# Patient Record
Sex: Female | Born: 1935
Health system: Midwestern US, Community
[De-identification: ages and names within clinical notes are randomized; demographics above are authoritative.]

---

## 2020-02-10 ENCOUNTER — Ambulatory Visit: Attending: Cardiovascular Disease

## 2020-02-10 ENCOUNTER — Ambulatory Visit: Admit: 2020-02-10 | Discharge: 2020-02-10 | Payer: MEDICARE | Attending: Cardiovascular Disease

## 2020-02-10 DIAGNOSIS — R42 Dizziness and giddiness: Secondary | ICD-10-CM

## 2020-02-10 NOTE — Progress Notes (Signed)
02/10/2020    Geraldo Pitter, NP    RE: Sanda Dejoy           11-08-1927    Dear Geraldo Pitter, NP    I had the pleasure of seeing your patient Heather Daniels in the office today. Although you are familiar with her past medical history, I shall review it here for my records.    HISTORY OF PRESENT ILLNESS: Heather Daniels is a 86 y.o. female who comes to the office today for had concerns including Palpitations and Dizziness.  Yesterday morning she noted some lightheadedness especially when she tried to do some activity around the house she also noted some racing of her heart this progressed over an hour and she went to [facility] emergency room there a baseline EKG revealed sinus rhythm with a prolonged first-degree AV block.  At other times she was tachycardic at a rate of 120 and there was a suggestion that the P wave was buried in the TUR this is not certain she initially presented with some hypotension which responded to fluids.  After several hours she felt better and she was discharged today she feels fine without any lightheadedness or racing.  She denies any chest discomfort dyspnea on exertion PND or orthopnea prior to this episode yesterday she occasionally had some racing that would last for seconds risk factors for CAD includes hyperlipidemia and hypertension    CURRENT MEDICATIONS:  Patient has a current medication list which includes the following prescription(s): pravastatin, escitalopram oxalate, oxybutynin chloride, alprazolam, olmesartan, multivitamin, and biotin.    ALLERGIES: Patient has No Known Allergies.    ROS: Review of Systems   Constitutional: Negative for chills and fever.   Eyes: Negative for double vision.   Respiratory: Negative for cough, sputum production and shortness of breath.    Cardiovascular: Positive for palpitations. Negative for chest pain, orthopnea, leg swelling and PND.   Gastrointestinal: Negative for abdominal pain, nausea  and vomiting.   Genitourinary: Negative for dysuria and hematuria.   Musculoskeletal: Negative for falls and myalgias.   Skin: Negative for rash.   Neurological: Positive for dizziness. Negative for sensory change, speech change, focal weakness, loss of consciousness and headaches.   Psychiatric/Behavioral: The patient is nervous/anxious.          FAMILY HISTORY: Patient History reviewed. No pertinent family history.    SOCIAL HISTORY: Patient   Social History     Socioeconomic History   ??? Marital status: UNKNOWN     Spouse name: Not on file   ??? Number of children: Not on file   ??? Years of education: Not on file   ??? Highest education level: Not on file   Tobacco Use   ??? Smoking status: Never Smoker   ??? Smokeless tobacco: Never Used     Social Determinants of Psychologist, prison and probation services Strain:    ??? Difficulty of Paying Living Expenses:    Food Insecurity:    ??? Worried About Programme researcher, broadcasting/film/video in the Last Year:    ??? Barista in the Last Year:    Transportation Needs:    ??? Freight forwarder (Medical):    ??? Lack of Transportation (Non-Medical):    Physical Activity:    ??? Days of Exercise per Week:    ??? Minutes of Exercise per Session:    Stress:    ??? Feeling of Stress :    Social Connections:    ??? Frequency  of Communication with Friends and Family:    ??? Frequency of Social Gatherings with Friends and Family:    ??? Attends Religious Services:    ??? Database administrator or Organizations:    ??? Attends Engineer, structural:    ??? Marital Status:        PAST MEDICAL HISTORY: Patient   Past Medical History:   Diagnosis Date   ??? Cardiac arrhythmia    ??? Hyperlipidemia    ??? Hypertension        PAST SURGICAL HISTORY: Patient    Past Surgical History:   Procedure Laterality Date   ??? HX TONSILLECTOMY           PE: Patient  height is 5\' 1"  (1.549 m) and weight is 120 lb (54.4 kg). Her blood pressure is 140/88 (abnormal) and her pulse is 74. Her respiration is 12.  She had no neck vein distention and her  carotids were 2+ her thyroid was not palpable her lungs were clear and she had a normal S1 normal S2 without a murmur or rub her abdomen was soft and she had no clubbing cyanosis or edema                      LABS & TESTING: Baseline EKG at [facility] revealed sinus rhythm with a prolonged first-degree left axis deviation poor R wave progression    ASSESSMENT AND PLAN: Patient with lightheadedness and abnormal EKG with marked arrhythmia?  Sinus arrhythmia.  She will go for thyroid function test she will return for an echocardiogram and stress test she may need an event monitor    I discussed the patient's BMI with her.  I have recommended the following interventions: dietary management education, guidance, and counseling.      I attest that current meds have been reviewed and are accurate to the best of my knowledge.    Thank you for the courtesy of this consultation.    Sincerely yours,    [facility] , MD

## 2020-02-10 NOTE — Telephone Encounter (Signed)
error 

## 2020-02-12 LAB — TSH 3RD GENERATION
TSH: 1.25 mIU/L (ref 0.40–4.50)
TSH: 1.25 mIU/L (ref 0.40–4.50)

## 2020-02-12 LAB — T4 (THYROXINE): T4, Total: 5.6 ug/dL (ref 5.1–11.9)

## 2020-02-12 LAB — T3 TOTAL: T3, total: 88 ng/dL (ref 76–181)

## 2020-02-12 LAB — T3: T3, Total: 88 ng/dL (ref 76–181)

## 2020-02-12 LAB — T4: T4, Total: 5.6 ug/dL (ref 5.1–11.9)

## 2020-03-01 ENCOUNTER — Encounter

## 2020-03-01 ENCOUNTER — Encounter: Attending: Cardiovascular Disease

## 2022-09-29 IMAGING — MR MRI BRAIN WITHOUT CONTRAST
7 of 11 series · 23 of 48 positions shown · IV contrast (gadolinium)
Comparison: None

________________________________________________________________________________________________ 
MRI BRAIN WITHOUT CONTRAST, 09/29/2022 [DATE]: 
CLINICAL INDICATION: Amnesia. Cognitive decline for 3 years. Slurred speech. 
No trauma.
TECHNIQUE: Multiplanar, multiecho position MR images of the brain were performed 
without intravenous gadolinium enhancement. Patient was scanned on a
magnet.

[Series 102: mpr - smartbrain · axial · 1.1mm · 1.09mm/px · z∈[+0,+174]mm · 2 of 2 slices shown]
[im 1/2]
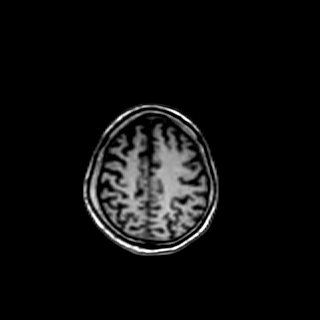
[im 2/2]
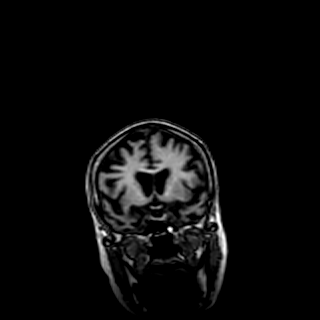

[Series 203: dadc map · axial · 5.0mm · 1.03mm/px · z∈[-146,+6]mm · 2 of 27 slices shown (1 of 2)]
[im 1/27]
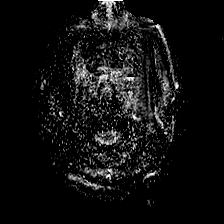
[im 27/27]
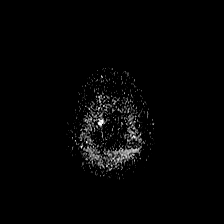

[Series 204: (id) · axial · 5.0mm · 1.03mm/px · z∈[-146,+6]mm · 2 of 27 slices shown (1 of 2)]
[im 1/27]
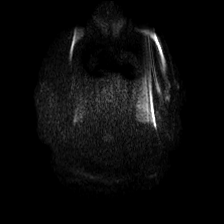
[im 27/27]
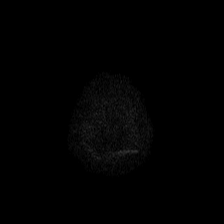

[Series 303: dadc map · coronal · 5.0mm · 0.90mm/px · 2 of 26 slices shown (2 of 2)]
[im 1/26]
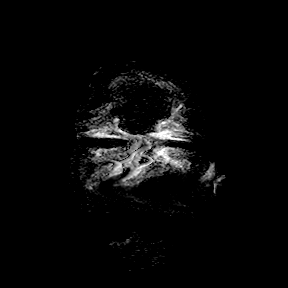
[im 26/26]
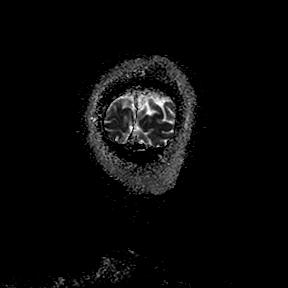

[Series 304: (id) · coronal · 5.0mm · 0.90mm/px · 2 of 27 slices shown (2 of 2)]
[im 1/27]
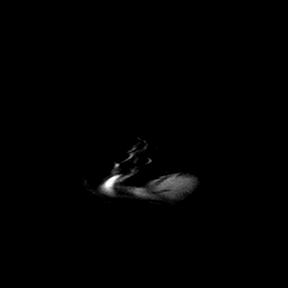
[im 27/27]
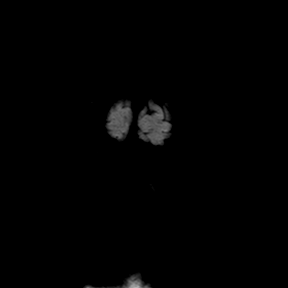

[Series 401: t1_se_sag · sagittal · 4.0mm · 0.43mm/px · 2 of 28 slices shown]
[im 1/28]
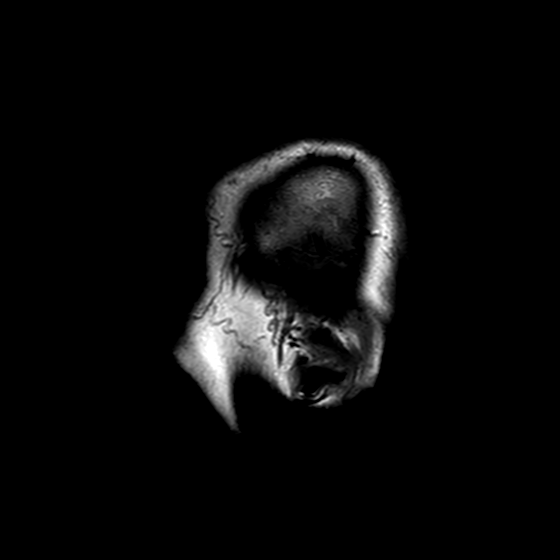
[im 28/28]
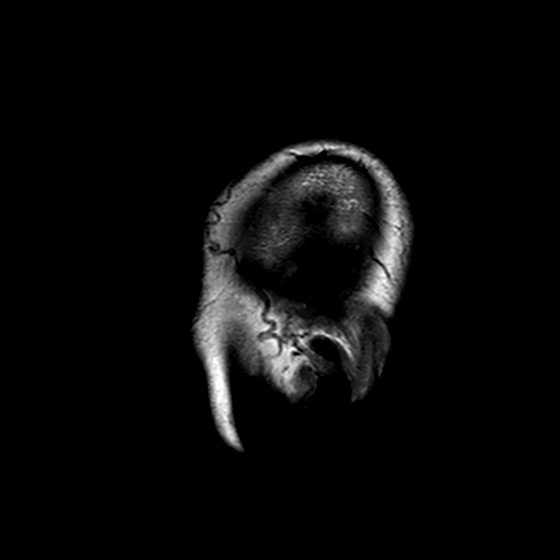

[Series 601: SWI · axial · 3.0mm · 0.40mm/px · z∈[-132,-5]mm · 11 of 200 slices shown]
[im 13/200]
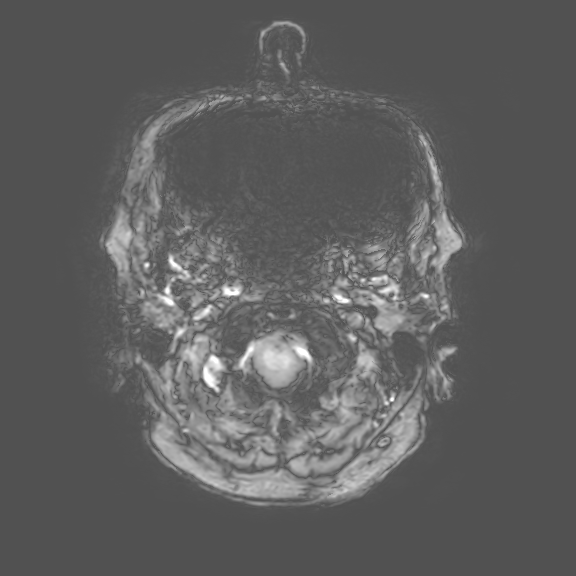
[im 25/200]
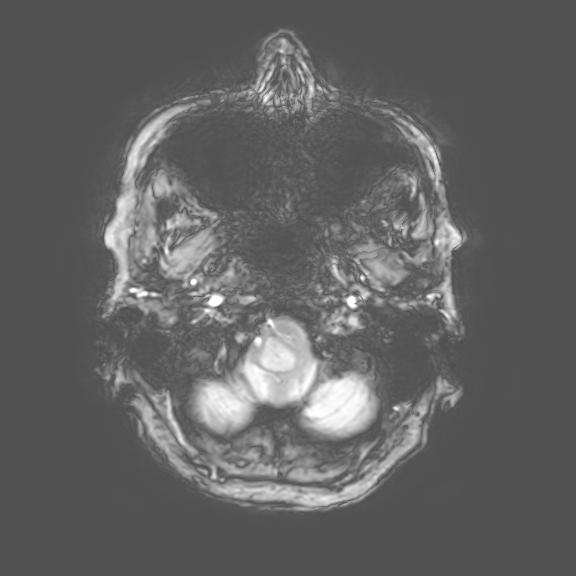
[im 38/200]
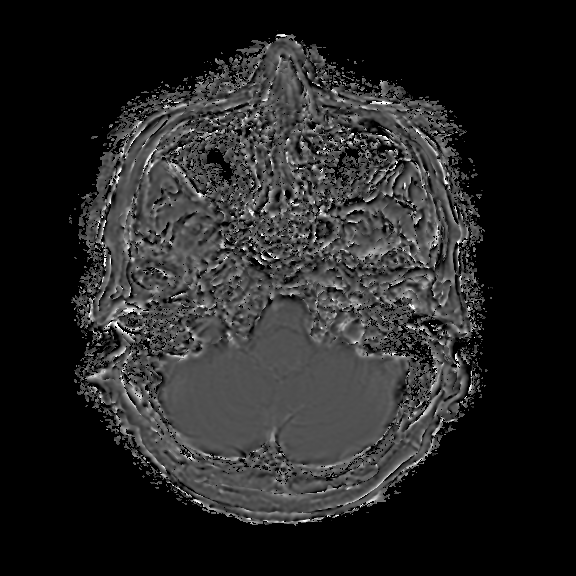
[im 63/200]
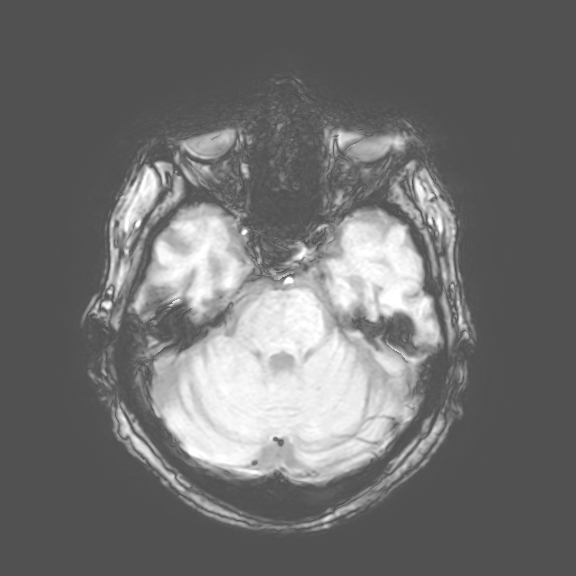
[im 88/200]
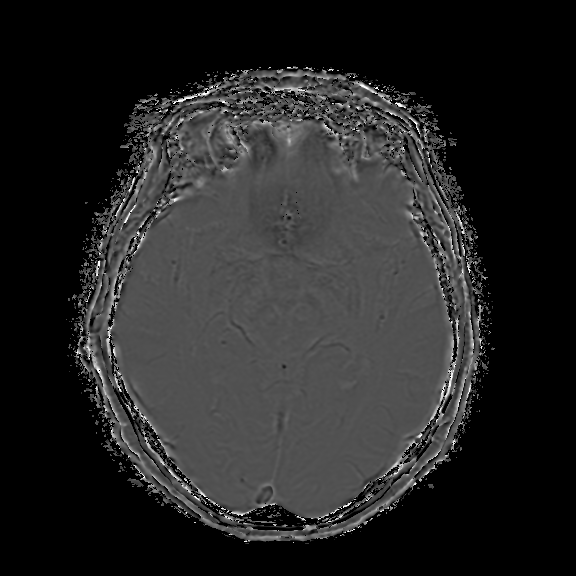
[im 100/200]
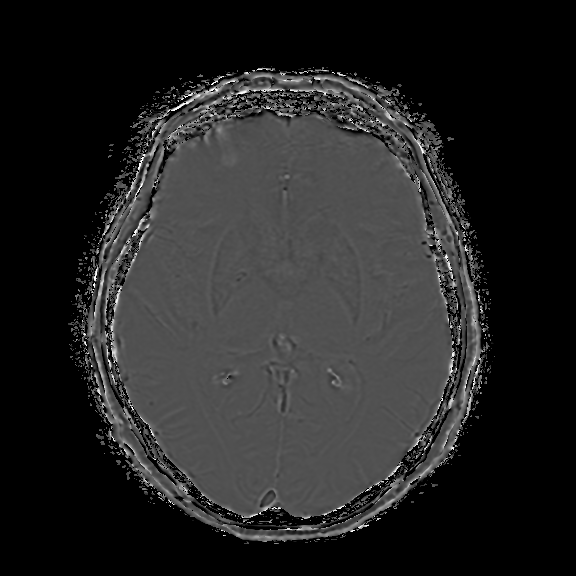
[im 112/200]
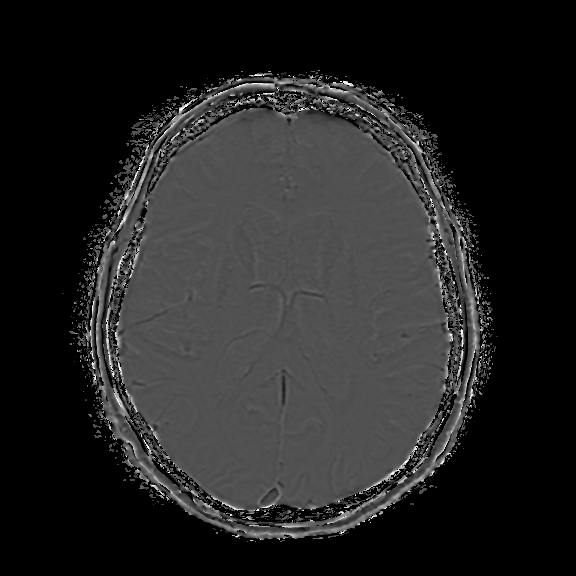
[im 137/200]
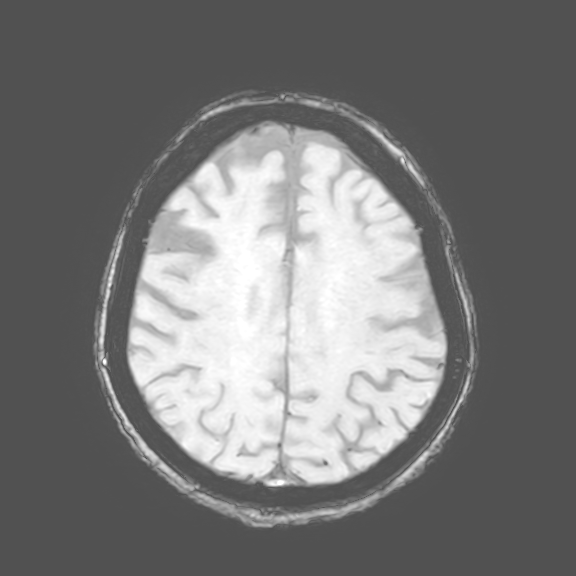
[im 162/200]
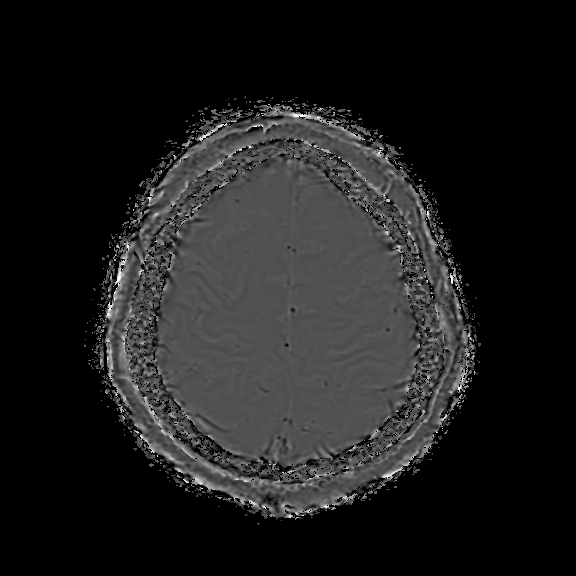
[im 175/200]
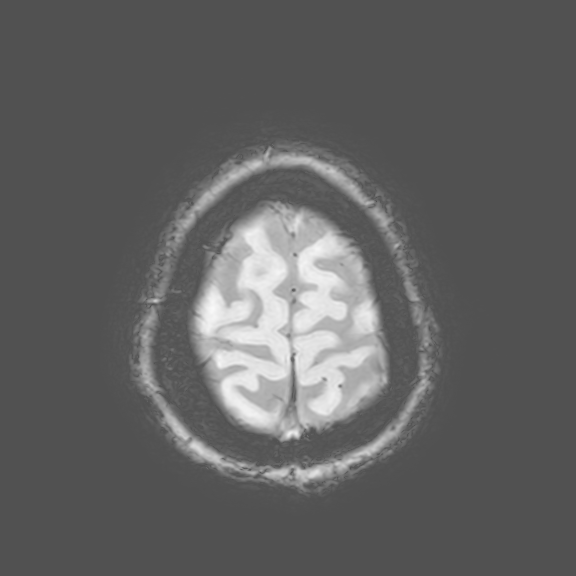
[im 187/200]
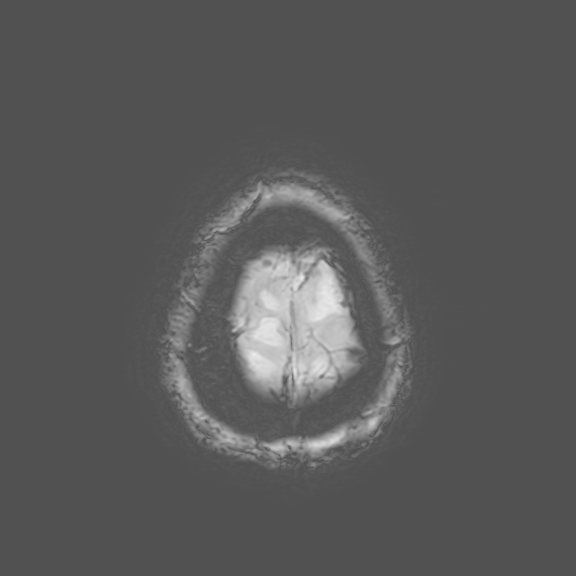

[23 of 48 positions shown; findings below may reference images not displayed]

FINDINGS: -------------------------------------------------------------------------------- 
------------------------- 
INTRACRANIAL: 
Mild to moderate degree of nonspecific periventricular and deep white matter T2 
FLAIR hyperintensity is most likely reflective of chronic small vessel vascular 
change. Perivascular space right corona radiata. No mass or abnormal extra-axial 
fluid collection. There is moderate hippocampal volume loss bilaterally, 
slightly more prominent on the right. 
No acute ischemia. No abnormal foci of susceptibility artifact in the brain. 
Patency of the intracranial vascular flow voids.  No acute intracranial 
hemorrhage, mass effect, midline shift. No large sellar mass. No hydrocephalus. 
-------------------------------------------------------------------------------- 
----------------------- 
OTHER: 
ORBITS/SINUSES/T-BONES:  Bilateral pseudophakia.  Mastoid air cells and middle 
ear cavities are grossly clear.  Mild left maxillary sinus membrane thickening. 
MARROW SIGNAL/SOFT TISSUES: No focal suspect signal abnormality.  
-------------------------------------------------------------------------------- 
-------------------
IMPRESSION: No acute intracranial process. Chronic appearing parenchymal changes as detailed 
above, including moderate hippocampal volume loss bilaterally, slightly more 
prominent on the right. 
If there is clinical concern for Alzheimers disease, consider amyloid PET/CT 
for further assessment.
